# Patient Record
Sex: Male | Born: 2000 | Race: White | Hispanic: No | Marital: Single | Smoking: Never smoker
Health system: Southern US, Community
[De-identification: ages and names within clinical notes are randomized; demographics above are authoritative.]

## PROBLEM LIST (undated history)

## (undated) DIAGNOSIS — J302 Other seasonal allergic rhinitis: Secondary | ICD-10-CM

## (undated) DIAGNOSIS — T7840XA Allergy, unspecified, initial encounter: Secondary | ICD-10-CM

## (undated) DIAGNOSIS — G43909 Migraine, unspecified, not intractable, without status migrainosus: Secondary | ICD-10-CM

## (undated) HISTORY — DX: Allergy, unspecified, initial encounter: T78.40XA

---
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
## 2019-01-24 IMAGING — CR DG ANKLE COMPLETE 3+V*R*
4 series · 4 of 4 positions shown · non-contrast
Comparison: None.

CLINICAL DATA: Right ankle pain following fall, initial encounter

EXAM:
RIGHT ANKLE - COMPLETE 3+ VIEW

[x ankle ap right]
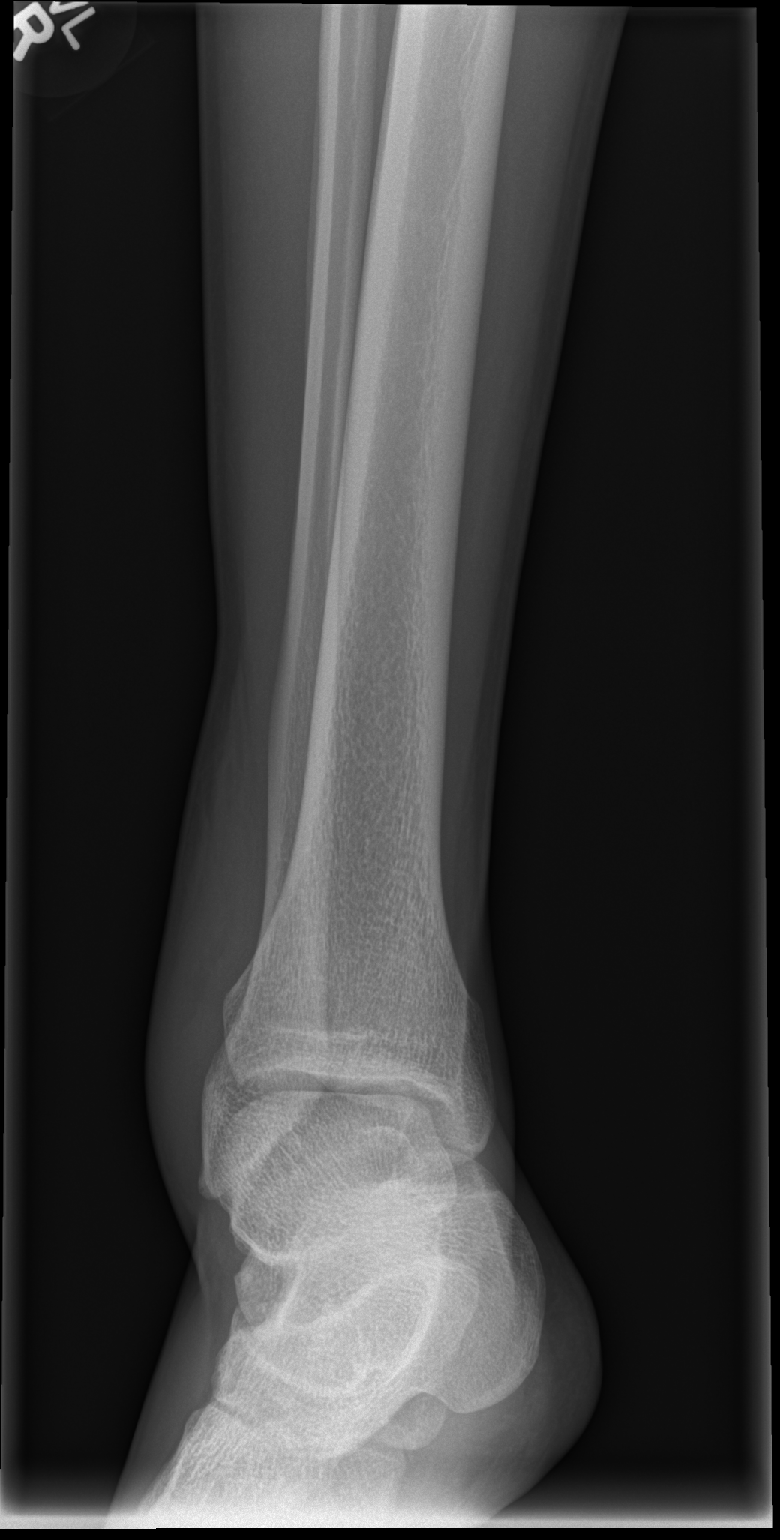

[x ankle obl right (1 of 2)]
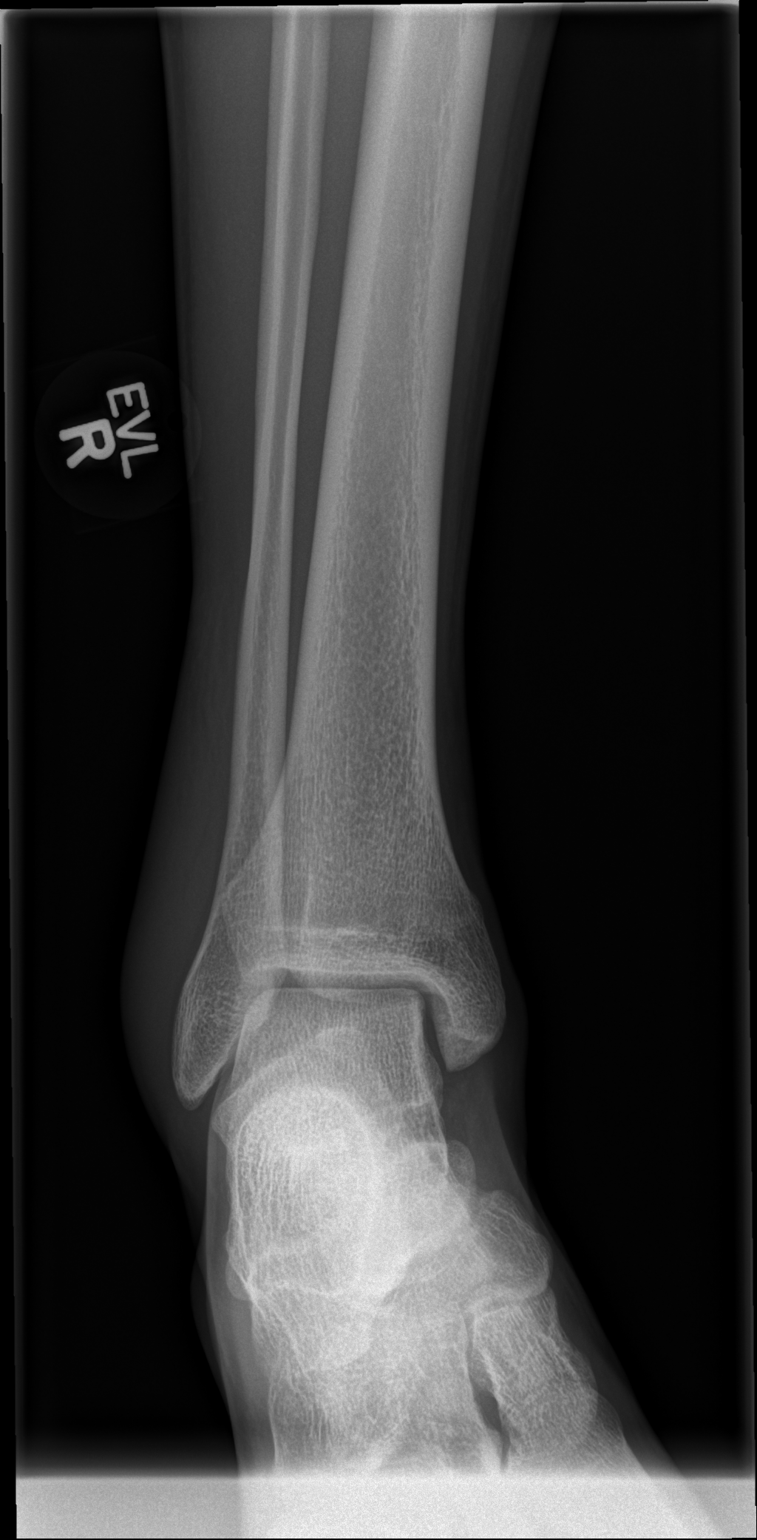

[x ankle obl right (2 of 2)]
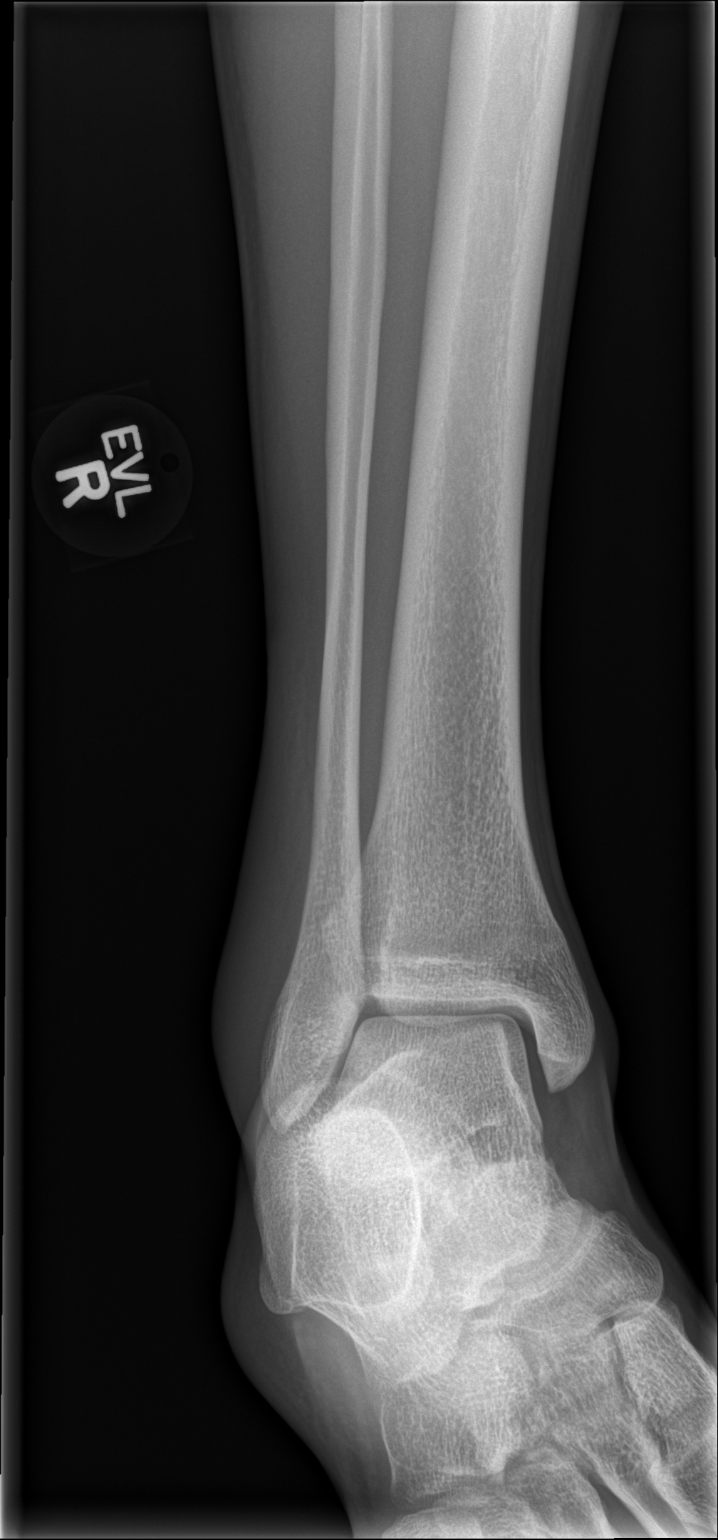

[x ankle lat right]
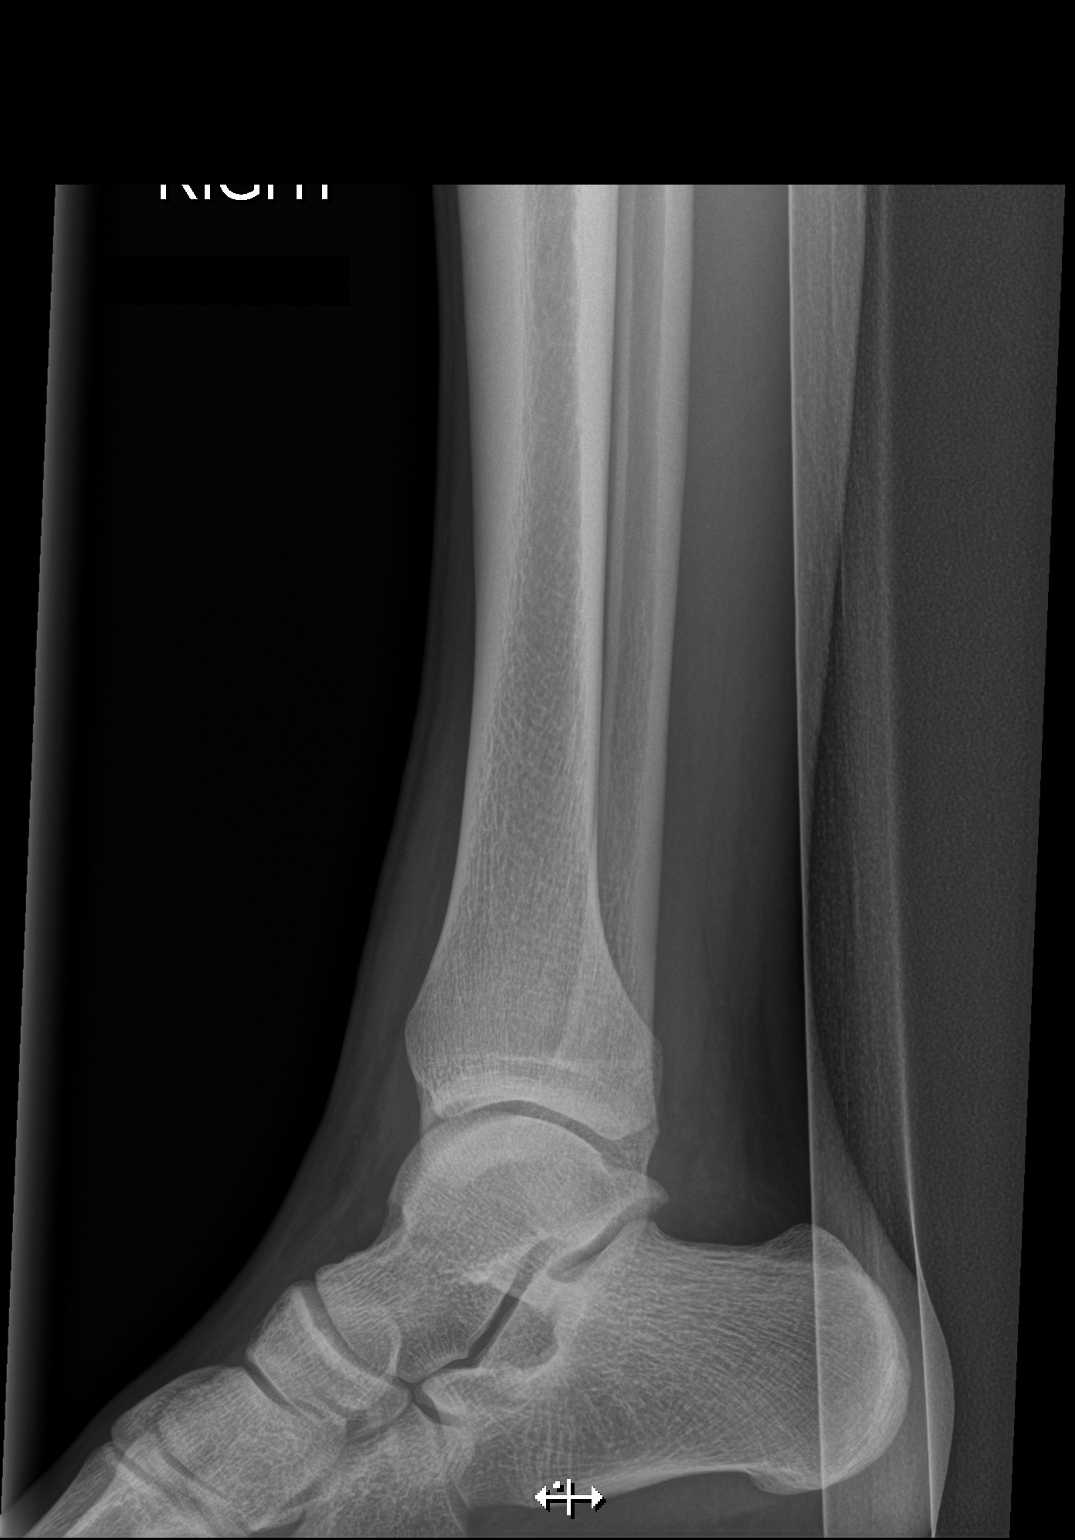

[4 of 4 positions shown; findings below may reference images not displayed]

FINDINGS: Soft tissue swelling is noted laterally. No acute fracture or
dislocation is seen. No other focal abnormality is noted.
IMPRESSION: Soft tissue swelling without acute bony abnormality.

## 2023-12-18 ENCOUNTER — Encounter: Payer: Self-pay | Admitting: Family Medicine

## 2023-12-18 DIAGNOSIS — G43909 Migraine, unspecified, not intractable, without status migrainosus: Secondary | ICD-10-CM | POA: Insufficient documentation

## 2023-12-18 DIAGNOSIS — J302 Other seasonal allergic rhinitis: Secondary | ICD-10-CM | POA: Insufficient documentation

## 2023-12-18 NOTE — Progress Notes (Unsigned)
    SUBJECTIVE:   CHIEF COMPLAINT / HPI:   Discussed the use of AI scribe software for clinical note transcription with the patient, who gave verbal consent to proceed.  Jose Pittman is a 23 y.o. who presents today for an annual exam.   History of Present Illness     Patient presents today to establish care. Other Concerns: ***   PMH: seasonal allergies, migraines   Medications: ***   Social History:  Lives with *** Employment: ***   Exercise: *** Diet: ***   Smoking: ***  Alcohol: *** Illicit Drug use: ***   Health Maintenance:  - due for HPV, flu, tetanus vaccines - HIV, Hep C screening  FH:  Cancers in family: *** Family history of early heart disease: ***  Needs HPV 2nd dose Check hep b vaccination status  ***If overweight, get cmp, lipid, A1c   OBJECTIVE:   There were no vitals taken for this visit.  Gen: well appearing, in NAD Card: RRR Lungs: CTAB Ext: WWP, no edema ***  ASSESSMENT/PLAN:   Assessment & Plan      Assessment & Plan Migraine without status migrainosus, not intractable, unspecified migraine type  Seasonal allergies  Annual Examination  See AVS for age appropriate recommendations  PHQ score ***, reviewed and discussed.  Blood pressure reviewed and at goal. ***   Advanced directive ***   Considered the following items based upon USPSTF recommendations: Diabetes screening: {FMCANNUALORDERED:33692} HIV testing:{FMCANNUALORDERED:33692} Hepatitis C: {FMCANNUALORDERED:33692} Hepatitis B:{FMCANNUALORDERED:33692} Syphilis if at high risk: {FMCANNUALORDERED:33692} GC/CT {GC/CT screening :23818} Lipid panel (nonfasting or fasting) discussed based upon AHA recommendations and {FMCLIPID:33694}.  Consider repeat every 4-6 years.  Reviewed risk factors for latent tuberculosis and {not indicated/requested/declined:14582} Vaccinations ***.   Follow up in 1 year or sooner if indicated.  MyChart Activation:  {MYCHARTLIST:32522}  Donald CHRISTELLA Lai, DO [facility]

## 2023-12-19 ENCOUNTER — Ambulatory Visit: Admitting: Family Medicine

## 2023-12-19 ENCOUNTER — Encounter: Payer: Self-pay | Admitting: Family Medicine

## 2023-12-19 VITALS — BP 134/82 | HR 86 | Ht 75.0 in | Wt 191.2 lb

## 2023-12-19 DIAGNOSIS — J302 Other seasonal allergic rhinitis: Secondary | ICD-10-CM | POA: Diagnosis not present

## 2023-12-19 DIAGNOSIS — G43909 Migraine, unspecified, not intractable, without status migrainosus: Secondary | ICD-10-CM | POA: Diagnosis not present

## 2023-12-19 DIAGNOSIS — Z0000 Encounter for general adult medical examination without abnormal findings: Secondary | ICD-10-CM | POA: Diagnosis not present

## 2023-12-19 DIAGNOSIS — Z202 Contact with and (suspected) exposure to infections with a predominantly sexual mode of transmission: Secondary | ICD-10-CM

## 2023-12-19 DIAGNOSIS — M6740 Ganglion, unspecified site: Secondary | ICD-10-CM | POA: Insufficient documentation

## 2023-12-19 NOTE — Patient Instructions (Signed)
 It was great to see you!  Our plans for today:  - Come back for labs at your convenience. You do not need to be fasting.  - Let me know if you need a referral to a hand surgeon for your wrist.   Take care and seek immediate care sooner if you develop any concerns.   Dr. Carlene Bickley

## 2023-12-19 NOTE — Assessment & Plan Note (Addendum)
 Ganglion cyst on right wrist, likely from basketball activity. Non-painful unless specific motions during basketball. Mobile, consistent with ganglion cyst. - Monitor for changes in pain or size. - Consider hand surgeon referral if pain increases or cyst becomes bothersome.

## 2024-02-07 ENCOUNTER — Other Ambulatory Visit

## 2024-02-07 DIAGNOSIS — Z0000 Encounter for general adult medical examination without abnormal findings: Secondary | ICD-10-CM

## 2024-02-07 DIAGNOSIS — Z202 Contact with and (suspected) exposure to infections with a predominantly sexual mode of transmission: Secondary | ICD-10-CM

## 2024-02-08 ENCOUNTER — Ambulatory Visit: Payer: Self-pay | Admitting: Family Medicine

## 2024-02-08 LAB — SYPHILIS: RPR W/REFLEX TO RPR TITER AND TREPONEMAL ANTIBODIES, TRADITIONAL SCREENING AND DIAGNOSIS ALGORITHM: RPR Ser Ql: NONREACTIVE

## 2024-02-08 LAB — COMPREHENSIVE METABOLIC PANEL WITH GFR
ALT: 98 IU/L — ABNORMAL HIGH (ref 0–44)
AST: 58 IU/L — ABNORMAL HIGH (ref 0–40)
Albumin: 5.2 g/dL (ref 4.3–5.2)
Alkaline Phosphatase: 89 IU/L (ref 47–123)
BUN/Creatinine Ratio: 16 (ref 9–20)
BUN: 15 mg/dL (ref 6–20)
Bilirubin Total: 1.2 mg/dL (ref 0.0–1.2)
CO2: 23 mmol/L (ref 20–29)
Calcium: 10 mg/dL (ref 8.7–10.2)
Chloride: 101 mmol/L (ref 96–106)
Creatinine, Ser: 0.95 mg/dL (ref 0.76–1.27)
Globulin, Total: 2.6 g/dL (ref 1.5–4.5)
Glucose: 84 mg/dL (ref 70–99)
Potassium: 3.9 mmol/L (ref 3.5–5.2)
Sodium: 141 mmol/L (ref 134–144)
Total Protein: 7.8 g/dL (ref 6.0–8.5)
eGFR: 114 mL/min/1.73

## 2024-02-08 LAB — HIV ANTIBODY (ROUTINE TESTING W REFLEX): HIV Screen 4th Generation wRfx: NONREACTIVE

## 2024-02-08 LAB — HEPATITIS C ANTIBODY: Hep C Virus Ab: NONREACTIVE
